# Patient Record
Sex: Female | Born: 1985 | Race: Black or African American | Hispanic: No | Marital: Single | Smoking: Current every day smoker
Health system: Southern US, Community
[De-identification: ages and names within clinical notes are randomized; demographics above are authoritative.]

## PROBLEM LIST (undated history)

## (undated) DIAGNOSIS — J45909 Unspecified asthma, uncomplicated: Secondary | ICD-10-CM

---

## 2010-12-29 ENCOUNTER — Inpatient Hospital Stay (HOSPITAL_COMMUNITY)
Admission: AD | Admit: 2010-12-29 | Discharge: 2010-12-29 | Payer: Self-pay | Source: Home / Self Care | Attending: Obstetrics & Gynecology | Admitting: Obstetrics & Gynecology

## 2010-12-29 LAB — RAPID URINE DRUG SCREEN, HOSP PERFORMED
Amphetamines: NOT DETECTED
Barbiturates: NOT DETECTED
Benzodiazepines: NOT DETECTED
Cocaine: NOT DETECTED
Opiates: NOT DETECTED
Tetrahydrocannabinol: NOT DETECTED

## 2010-12-29 LAB — CBC
HCT: 27 % — ABNORMAL LOW (ref 36.0–46.0)
Hemoglobin: 8.8 g/dL — ABNORMAL LOW (ref 12.0–15.0)
MCH: 25.7 pg — ABNORMAL LOW (ref 26.0–34.0)
MCHC: 32.6 g/dL (ref 30.0–36.0)
MCV: 78.9 fL (ref 78.0–100.0)
Platelets: 192 10*3/uL (ref 150–400)
RBC: 3.42 MIL/uL — ABNORMAL LOW (ref 3.87–5.11)
RDW: 13.5 % (ref 11.5–15.5)
WBC: 6.6 10*3/uL (ref 4.0–10.5)

## 2010-12-29 LAB — TYPE AND SCREEN
ABO/RH(D): O NEG
Antibody Screen: NEGATIVE

## 2010-12-29 LAB — RAPID HIV SCREEN (WH-MAU): Rapid HIV Screen: NONREACTIVE

## 2010-12-30 LAB — RPR: RPR Ser Ql: NONREACTIVE

## 2010-12-30 LAB — ABO/RH: ABO/RH(D): O NEG

## 2010-12-31 LAB — STREP B DNA PROBE: Strep Group B Ag: NEGATIVE

## 2012-03-07 IMAGING — US US OB COMP +14 WK
1 series · 12 of 28 positions shown · non-contrast
Comparison: none

[Series 1: us ob comp +14 wk · 12 of 50 slices shown]
[im 2/50]
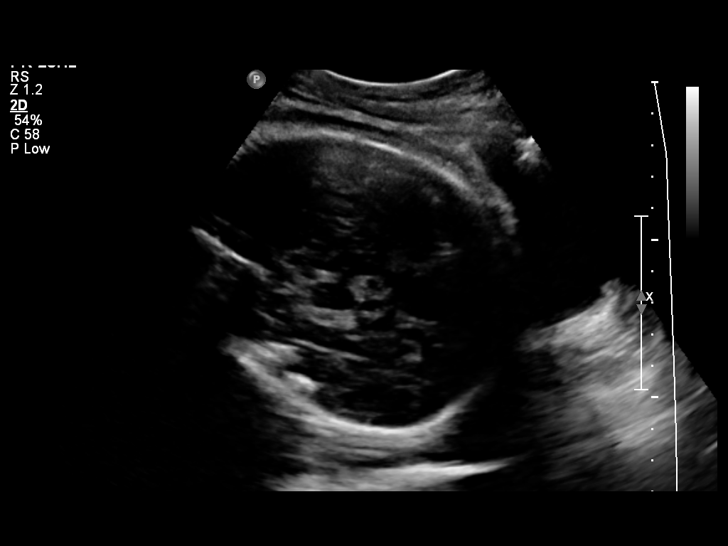
[im 6/50]
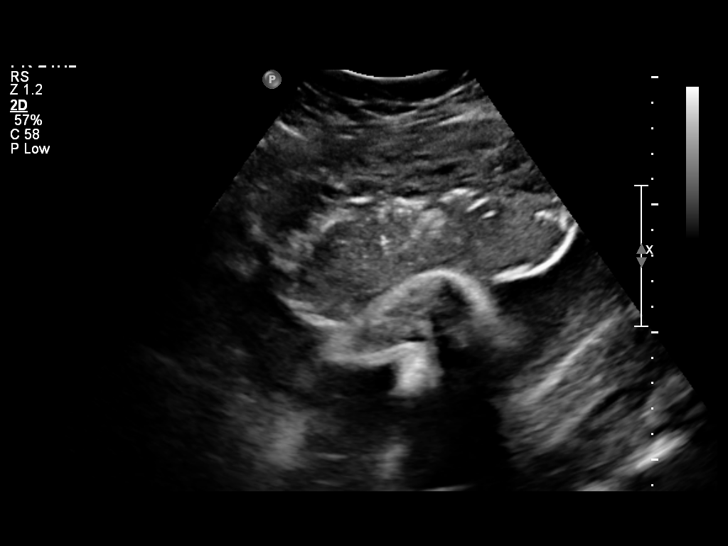
[im 10/50]
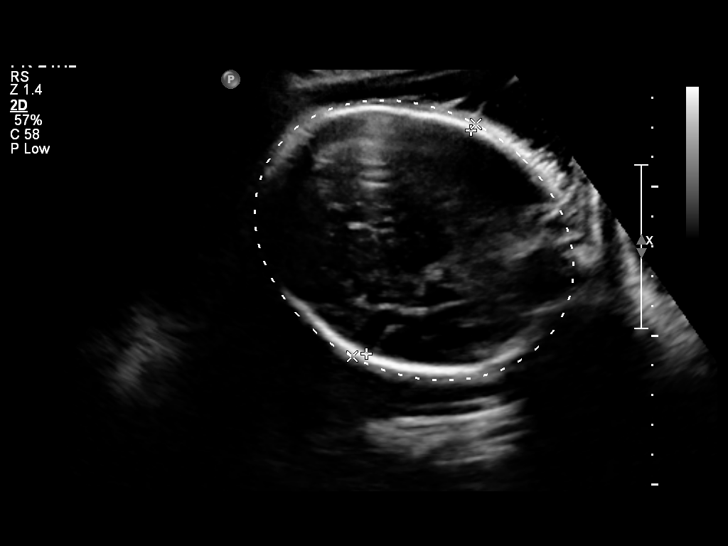
[im 15/50]
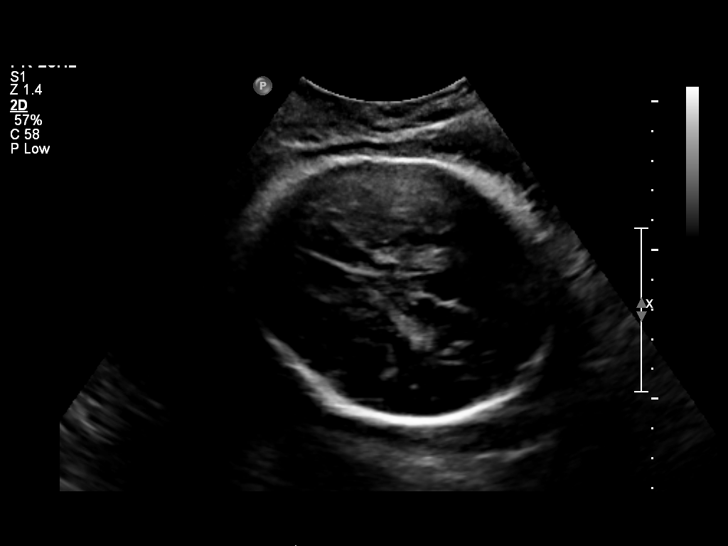
[im 19/50]
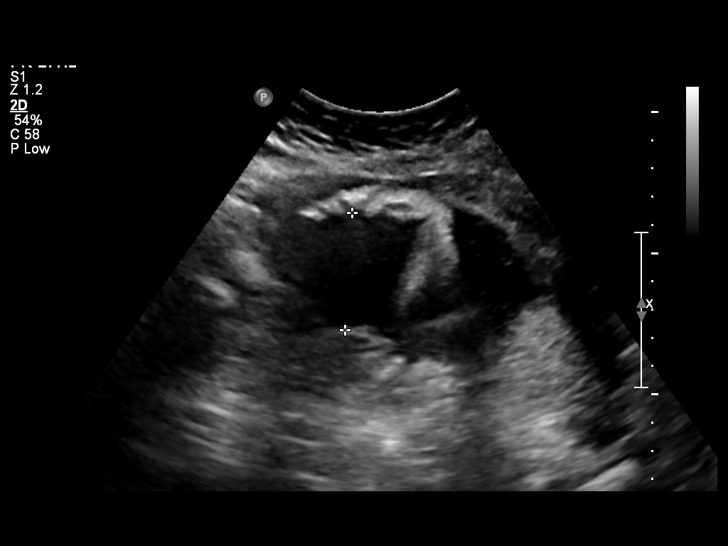
[im 22/50]
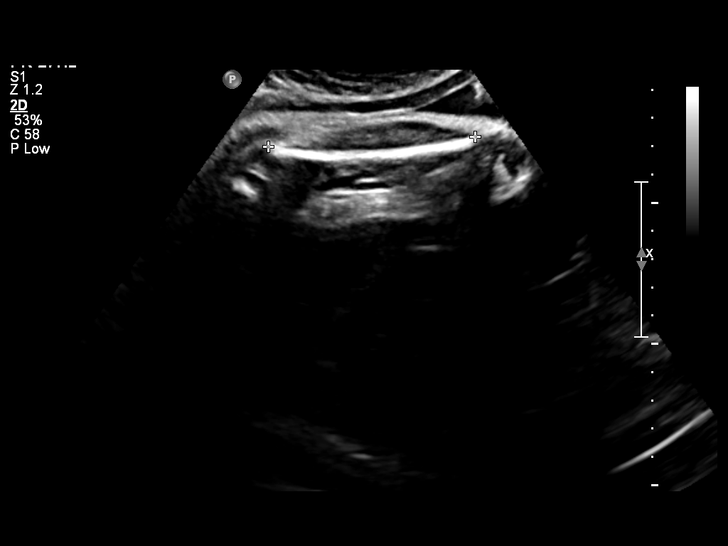
[im 28/50]
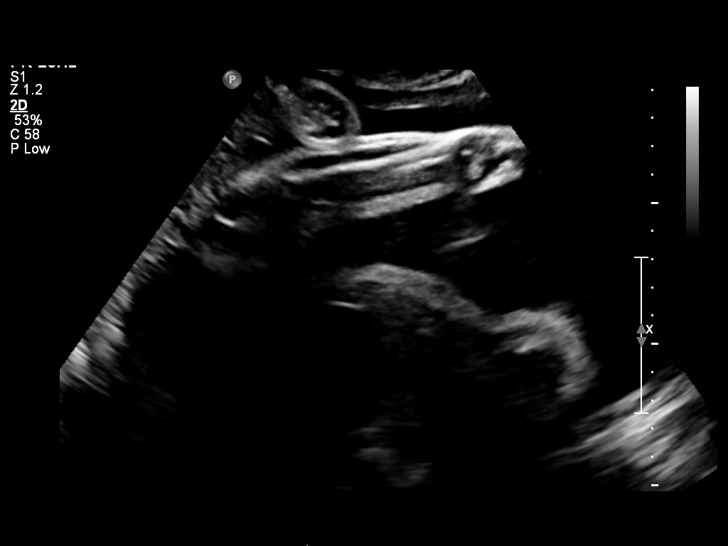
[im 31/50]
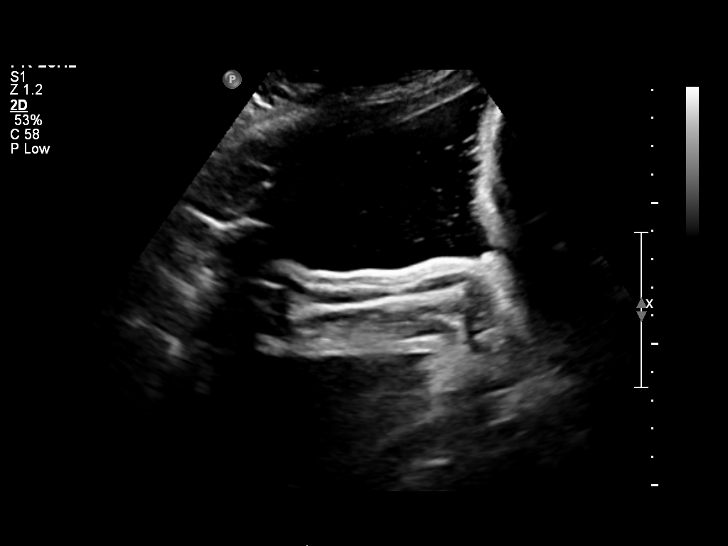
[im 35/50]
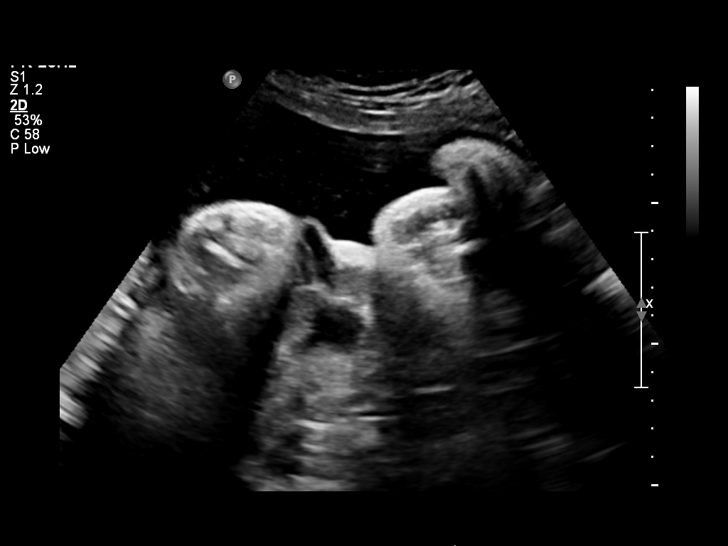
[im 40/50]
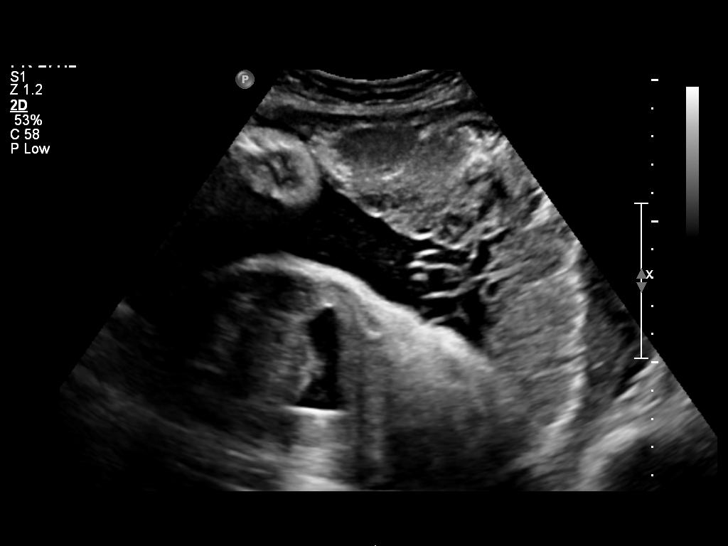
[im 44/50]
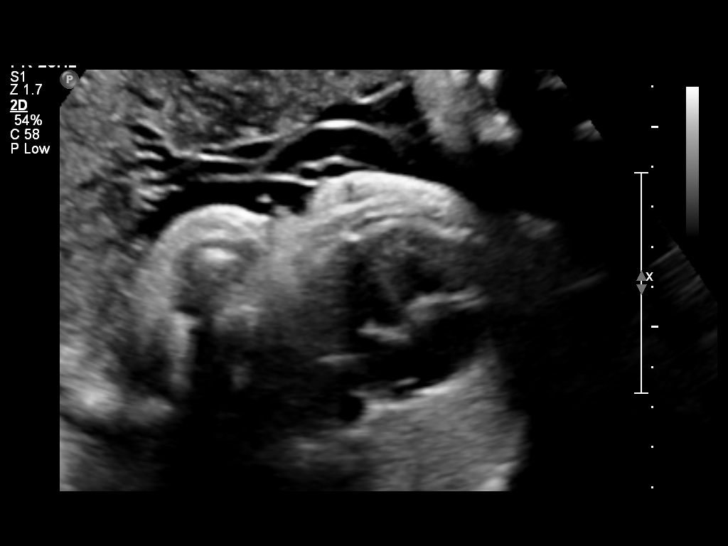
[im 48/50]
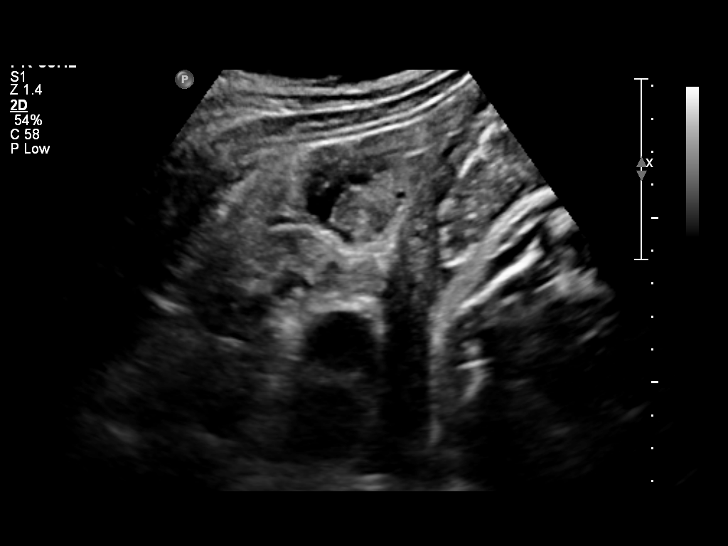

[12 of 28 positions shown; findings below may reference images not displayed]

OBSTETRICS REPORT
                      (Signed Final 12/29/2010 [DATE])

Procedures

 US OB COMP +14 WK                                     76805.1
Indications

 Migraine
 No or Little Prenatal Care
Fetal Evaluation

 Fetal Heart Rate:  133                          bpm
 Cardiac Activity:  Observed
 Presentation:      Cephalic
 Placenta:          Anterior rt, above cervical
                    os

 Amniotic Fluid
 AFI FV:      Subjectively within normal limits
 AFI Sum:     20.26   cm       89  %Tile     Larg Pckt:    7.26  cm
 RUQ:   4.88    cm   RLQ:    4.14   cm    LUQ:   7.26    cm   LLQ:    3.98   cm
Biometry

 BPD:     87.6  mm     G. Age:  35w 3d                CI:        75.04   70 - 86
                                                      FL/HC:      23.1   20.7 -

 HC:     320.8  mm     G. Age:  36w 1d      < 3  %    HC/AC:      0.97   0.87 -

 AC:     330.1  mm     G. Age:  36w 6d        7  %    FL/BPD:     84.5   71 - 87
 FL:        74  mm     G. Age:  37w 6d       14  %    FL/AC:      22.4   20 - 24

 Est. FW:    6884  gm    6 lb 12 oz      30  %
Gestational Age

 Clinical EDD:  40w 0d                                        EDD:   12/29/10
 U/S Today:     36w 4d                                        EDD:   01/22/11
 Best:          40w 0d     Det. By:  Clinical EDD             EDD:   12/29/10
Anatomy

 Cranium:           Appears normal      Aortic Arch:       Not well
                                                           visualized
 Fetal Cavum:       Not well            Ductal Arch:       Not well
                    visualized                             visualized
 Ventricles:        Appears normal      Diaphragm:         Not well
                                                           visualized
 Choroid Plexus:    Appears normal      Stomach:           Appears normal
 Cerebellum:        Not well            Abdomen:           Appears normal
                    visualized
 Posterior Fossa:   Not well            Abdominal Wall:    Appears nml
                    visualized                             (cord insert,
                                                           abd wall)
 Nuchal Fold:       Not applicable      Cord Vessels:      Appears normal
                    (>20 wks GA)                           (3 vessel cord)
 Face:              Appears normal      Kidneys:           Appear normal
                    (/orbits)
 Heart:             Appears normal      Bladder:           Appears normal
                    (4 chamber &
                    axis)
 RVOT:              Not well            Spine:             Not well
                    visualized                             visualized
 LVOT:              Not well            Limbs:             Four extremities
                    visualized                             seen

 Other:     Technically difficult due to advanced GA and fetal
            position.
Cervix Uterus Adnexa

 Cervix:       Not visualized (advanced GA >34 wks)

 Left Ovary:    Not visualized.
 Right Ovary:   Within normal limits.
Impression

 Single live IUP in cephalic presentation.  Measuring EFW
 30%ile but by dates measuring >3 wks behind GA by LMP,
 although this may in part be due to variability in biometry at
 this late gestational age. Correlation with outside prior exams
 could be helpful to determine overall growth trend.

 questions or concerns.

## 2016-10-30 ENCOUNTER — Encounter (HOSPITAL_BASED_OUTPATIENT_CLINIC_OR_DEPARTMENT_OTHER): Payer: Self-pay | Admitting: Emergency Medicine

## 2016-10-30 ENCOUNTER — Emergency Department (HOSPITAL_BASED_OUTPATIENT_CLINIC_OR_DEPARTMENT_OTHER)
Admission: EM | Admit: 2016-10-30 | Discharge: 2016-10-30 | Disposition: A | Discharge status: Home / Self Care | Payer: Medicaid Other | Attending: Emergency Medicine | Admitting: Emergency Medicine

## 2016-10-30 DIAGNOSIS — H9202 Otalgia, left ear: Secondary | ICD-10-CM | POA: Insufficient documentation

## 2016-10-30 DIAGNOSIS — K08199 Complete loss of teeth due to other specified cause, unspecified class: Secondary | ICD-10-CM

## 2016-10-30 DIAGNOSIS — R51 Headache: Secondary | ICD-10-CM | POA: Insufficient documentation

## 2016-10-30 DIAGNOSIS — K08409 Partial loss of teeth, unspecified cause, unspecified class: Secondary | ICD-10-CM | POA: Diagnosis not present

## 2016-10-30 MED ORDER — OXYCODONE-ACETAMINOPHEN 5-325 MG PO TABS: 1.0000 | ORAL_TABLET | Freq: Four times a day (QID) | ORAL | 0 refills | Status: DC | PRN

## 2016-10-30 MED ORDER — IBUPROFEN 600 MG PO TABS: 600.0000 mg | ORAL_TABLET | Freq: Four times a day (QID) | ORAL | 0 refills | Status: DC | PRN

## 2016-10-30 NOTE — ED Notes (Signed)
Pt c/o left ear and head pain onset Wed. Denies trouble hearing, drainage or other s/s. Has not taken any meds for same. Had teeth pulled Wed and they gave her meds for same which she took and are not helping.

## 2016-10-30 NOTE — ED Provider Notes (Signed)
[facility]   CSN: [id number] Arrival date & time: 10/30/16  1721  By signing my name below, I, Phillis HaggisGabriella Gaje, attest that this documentation has been prepared under the direction and in the presence of Melburn HakeNicole Kileen Lange, [place]C. Electronically Signed: Phillis HaggisGabriella Gaje, ED Scribe. 10/30/16. 6:06 PM.  History   Chief Complaint Chief Complaint  Patient presents with  . Dental Pain   The history is provided by the patient. No language interpreter was used.   HPI Comments: Paula Wilkerson is a 30 y.o. female who presents to the Emergency Department complaining of gradually worsening, throbbing left ear pain s/p left upper and lower tooth extractions occurring 4 days ago. She reports associated mild intermittent headache. Pt has worsening pain with palpation of the area. She has been taking Lortab as prescribed by the dentist for her symptoms with no relief. Pt has a follow up appointment with her dentist next week. She denies fever, chills, dental pain, congestion, ear discharge, facial or neck swelling, sinus pressure, or sore throat.   History reviewed. No pertinent past medical history.  There are no active problems to display for this patient.   Past Surgical History:  Procedure Laterality Date  . CESAREAN SECTION      OB History    No data available       Home Medications    Prior to Admission medications   Medication Sig Start Date End Date Taking? Authorizing Provider  ibuprofen (ADVIL,MOTRIN) 600 MG tablet Take 1 tablet (600 mg total) by mouth every 6 (six) hours as needed. 10/30/16   Barrett HenleNicole Elizabeth Boden Stucky, PA-C  oxyCODONE-acetaminophen (PERCOCET/ROXICET) 5-325 MG tablet Take 1 tablet by mouth every 6 (six) hours as needed. 10/30/16   Barrett HenleNicole Elizabeth Shawnita Krizek, PA-C    Family History History reviewed. No pertinent family history.  Social History Social History  Substance Use Topics  . Smoking status: Not on file  . Smokeless tobacco: Not on file   . Alcohol use Not on file     Allergies   Patient has no known allergies.  Review of Systems Review of Systems  Constitutional: Negative for chills and fever.  HENT: Positive for ear pain. Negative for congestion, dental problem, ear discharge, facial swelling, sinus pressure and sore throat.   Neurological: Positive for headaches.   Physical Exam Updated Vital Signs BP 132/87   Pulse 83   Temp 98.2 F (36.8 C)   Resp 18   Wt 91.6 kg   LMP 10/16/2016 (Approximate)   SpO2 100%   Physical Exam  Constitutional: She is oriented to person, place, and time. She appears well-developed and well-nourished.  HENT:  Head: Normocephalic and atraumatic.  Right Ear: Tympanic membrane and ear canal normal.  Left Ear: Hearing, tympanic membrane, external ear and ear canal normal. No lacerations. No drainage, swelling or tenderness. No foreign bodies. No mastoid tenderness. Tympanic membrane is not injected, not scarred, not perforated, not erythematous, not retracted and not bulging. Tympanic membrane mobility is normal.  No middle ear effusion. No hemotympanum. No decreased hearing is noted.  Mouth/Throat: Uvula is midline and oropharynx is clear and moist. No trismus in the jaw. Abnormal dentition. No dental abscesses. No oropharyngeal exudate, posterior oropharyngeal edema, posterior oropharyngeal erythema or tonsillar abscesses. No tonsillar exudate.  Two posterior left upper and lower molars extracted with no significant swelling, erythema, induration, fluctuance, or drainage. Mild TTP of the left upper and lower gumlines around these areas. No drooling. Pt tolerating secretions.   Eyes:  Conjunctivae and EOM are normal. Right eye exhibits no discharge. Left eye exhibits no discharge. No scleral icterus.  Neck: Normal range of motion. Neck supple.  Cardiovascular: Normal rate, regular rhythm, normal heart sounds and intact distal pulses.   Pulmonary/Chest: Effort normal and breath sounds  normal.  Abdominal: Soft. She exhibits no distension.  Musculoskeletal: She exhibits no edema.  Lymphadenopathy:    She has no cervical adenopathy.  Neurological: She is alert and oriented to person, place, and time.  Skin: Skin is warm and dry.  Nursing note and vitals reviewed.    ED Treatments / Results  DIAGNOSTIC STUDIES: Oxygen Saturation is 100% on RA, normal by my interpretation.    COORDINATION OF CARE: 6:05 PM-Discussed treatment plan which includes pain control with pt at bedside and pt agreed to plan.    Labs (all labs ordered are listed, but only abnormal results are displayed) Labs Reviewed - No data to display  EKG  EKG Interpretation None       Radiology No results found.  Procedures Procedures (including critical care time)  Medications Ordered in ED Medications - No data to display   Initial Impression / Assessment and Plan / ED Course  I have reviewed the triage vital signs and the nursing notes.  Pertinent labs & imaging results that were available during my care of the patient were reviewed by me and considered in my medical decision making (see chart for details).  Clinical Course     Patient presents with left ear pain that hasn't present since having multiple left upper and lower molars extracted 4 days ago. She reports she was given Lortab by her dentist without relief of symptoms. Denies fever, ear drainage, loss of hearing, facial swelling. VSS. Exam revealed extraction of left 2 upper and lower posterior molars with no signs of infection or abscess present, mild tenderness palpation. Ear exam unremarkable, TMs clear and normal, hearing grossly intact. No mastoid tenderness. Suspect patient's symptoms are likely related to recent teeth extraction. Plan to discharge patient home with pain meds and symptomatically treatment. Advised patient to follow up with her dentist sooner if her symptoms have not improved. Discussed return  precautions.  Final Clinical Impressions(s) / ED Diagnoses   Final diagnoses:  Left ear pain   I personally performed the services described in this documentation, which was scribed in my presence. The recorded information has been reviewed and is accurate.   New Prescriptions New Prescriptions   IBUPROFEN (ADVIL,MOTRIN) 600 MG TABLET    Take 1 tablet (600 mg total) by mouth every 6 (six) hours as needed.   OXYCODONE-ACETAMINOPHEN (PERCOCET/ROXICET) 5-325 MG TABLET    Take 1 tablet by mouth every 6 (six) hours as needed.     Satira Sarkicole Elizabeth DowningNadeau, [place]-C 10/30/16 1812    Nira ConnPedro Eduardo Cardama, MD 10/31/16 1325

## 2016-10-30 NOTE — ED Triage Notes (Signed)
Pt in c/o L ear pain but had tooth pulled from L upper side x 4 days ago. Pt alert, interactive, ambulatory in NAD.

## 2016-10-30 NOTE — Discharge Instructions (Signed)
Take your medications as prescribed as needed for pain relief.  I recommend following up with your dentist at your scheduled appointment, however if your pain has not improved over the next 1-2 days I recommend calling them sooner to schedule an earlier follow-up appointment. Please return to the Emergency Department if symptoms worsen or new onset of fever, facial/neck swelling, difficulty breathing, difficulty swallowing resulting in drooling, unable to open your jaw, ear drainage, loss of hearing, vomiting, unable to keep fluids down.

## 2016-10-30 NOTE — ED Notes (Signed)
Pt given d/c instructions as per chart. Verbalizes understanding. No questions. Rx x 2 with precautions. 

## 2016-11-21 ENCOUNTER — Emergency Department (HOSPITAL_BASED_OUTPATIENT_CLINIC_OR_DEPARTMENT_OTHER)
Admission: EM | Admit: 2016-11-21 | Discharge: 2016-11-21 | Disposition: A | Discharge status: Home / Self Care | Payer: Medicaid Other | Attending: Emergency Medicine | Admitting: Emergency Medicine

## 2016-11-21 ENCOUNTER — Emergency Department (HOSPITAL_BASED_OUTPATIENT_CLINIC_OR_DEPARTMENT_OTHER): Payer: Medicaid Other

## 2016-11-21 ENCOUNTER — Encounter (HOSPITAL_BASED_OUTPATIENT_CLINIC_OR_DEPARTMENT_OTHER): Payer: Self-pay | Admitting: Emergency Medicine

## 2016-11-21 DIAGNOSIS — Y9389 Activity, other specified: Secondary | ICD-10-CM | POA: Diagnosis not present

## 2016-11-21 DIAGNOSIS — X503XXA Overexertion from repetitive movements, initial encounter: Secondary | ICD-10-CM

## 2016-11-21 DIAGNOSIS — M778 Other enthesopathies, not elsewhere classified: Secondary | ICD-10-CM | POA: Diagnosis not present

## 2016-11-21 DIAGNOSIS — M70842 Other soft tissue disorders related to use, overuse and pressure, left hand: Secondary | ICD-10-CM | POA: Insufficient documentation

## 2016-11-21 DIAGNOSIS — F17200 Nicotine dependence, unspecified, uncomplicated: Secondary | ICD-10-CM | POA: Diagnosis not present

## 2016-11-21 DIAGNOSIS — M25532 Pain in left wrist: Secondary | ICD-10-CM | POA: Diagnosis present

## 2016-11-21 DIAGNOSIS — T148XXA Other injury of unspecified body region, initial encounter: Secondary | ICD-10-CM

## 2016-11-21 NOTE — ED Notes (Signed)
Pt given d/c instructions as per chart. Verbalizes understanding. No questions. 

## 2016-11-21 NOTE — Discharge Instructions (Signed)
Wear wrist brace for at least 1 week for rest and comfort. Use ice or heat, and elevate wrist throughout the day, using ice/heat pack for no more than 20 minutes every hour.  Alternate between tylenol and motrin as needed for pain. Follow up with the sports medicine specialist in the next 1-2 weeks for recheck of symptoms, that can be cancelled within 48 hours if symptoms completely resolve. Return to the ER for changes or worsening symptoms.

## 2016-11-21 NOTE — ED Provider Notes (Signed)
[facility]   CSN: [id number] Arrival date & time: 11/21/16  1827  By signing my name below, I, Soijett Blue, attest that this documentation has been prepared under the direction and in the presence of Levi StraussMercedes Camprubi-Soms, VF CorporationPA-C Electronically Signed: Soijett Blue, ED Scribe. 11/21/16. 8:29 PM.   History   Chief Complaint Chief Complaint  Patient presents with  . Wrist Pain    HPI Paula Wilkerson is a 30 y.o. female who presents to the Emergency Department complaining of left wrist pain onset 3 days. Pt describes her left wrist pain as 10/10, constant, aching and throbbing, and it does radiate to her left fingers and left forearm. She states that cold air worsens her left wrist pain. 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 She reports that she has not tried anything for her symptoms, no known alleviating factors. Pt denies bruising, redness, swelling, warmth, numbness, tingling, focal weakness, fever, chills, CP, SOB, abdominal pain, nausea, vomiting, diarrhea, constipation, urinary symptoms, rashes/skin changes, and any other symptoms. Denies injury or falls.    The history is provided by the patient and medical records. No language interpreter was used.  Wrist Pain  This is a new problem. The current episode started more than 2 days ago. The problem occurs constantly. The problem has not changed since onset.Pertinent negatives include no chest pain, no abdominal pain and no shortness of breath. The symptoms are aggravated by bending (cold air). Nothing relieves the symptoms. She has tried nothing for the symptoms. The treatment provided no relief.    History reviewed. No pertinent past medical history.  There are no active problems to display for this patient.   Past Surgical History:  Procedure Laterality Date  . CESAREAN SECTION      OB History    No data available       Home Medications    Prior to Admission  medications   Medication Sig Start Date End Date Taking? Authorizing Provider  ibuprofen (ADVIL,MOTRIN) 600 MG tablet Take 1 tablet (600 mg total) by mouth every 6 (six) hours as needed. 10/30/16   Barrett HenleNicole Elizabeth Nadeau, PA-C  oxyCODONE-acetaminophen (PERCOCET/ROXICET) 5-325 MG tablet Take 1 tablet by mouth every 6 (six) hours as needed. 10/30/16   Barrett HenleNicole Elizabeth Nadeau, PA-C    Family History History reviewed. No pertinent family history.  Social History Social History  Substance Use Topics  . Smoking status: Current Every Day Smoker  . Smokeless tobacco: Never Used  . Alcohol use No     Allergies   Patient has no known allergies.   Review of Systems Review of Systems  Constitutional: Negative for chills and fever.  Respiratory: Negative for shortness of breath.   Cardiovascular: Negative for chest pain.  Gastrointestinal: Negative for abdominal pain, constipation, diarrhea, nausea and vomiting.  Genitourinary: Negative for dysuria and hematuria.  Musculoskeletal: Positive for arthralgias. Negative for joint swelling and myalgias.  Skin: Negative for color change and wound.  Allergic/Immunologic: Negative for immunocompromised state.  Neurological: Negative for weakness and numbness.  Psychiatric/Behavioral: Negative for confusion.     A complete 10 system review of systems was obtained and all systems are negative except as noted in the HPI and PMH.   Physical Exam Updated Vital Signs BP 113/87 (BP Location: Right Arm)   Pulse 85   Temp 98.2 F (36.8 C) (Oral)   Resp 18   Ht 5\' 5"  (1.651 m)   Wt 204 lb 3.2 oz (92.6  kg)   LMP 11/16/2016   SpO2 100%   BMI 33.98 kg/m   Physical Exam  Constitutional: She is oriented to person, place, and time. Vital signs are normal. She appears well-developed and well-nourished.  Non-toxic appearance. No distress.  Afebrile, nontoxic, NAD  HENT:  Head: Normocephalic and atraumatic.  Mouth/Throat: Mucous membranes are normal.    Eyes: Conjunctivae and EOM are normal. Right eye exhibits no discharge. Left eye exhibits no discharge.  Neck: Normal range of motion. Neck supple.  Cardiovascular: Normal rate and intact distal pulses.   Pulmonary/Chest: Effort normal. No respiratory distress.  Abdominal: Normal appearance. She exhibits no distension.  Musculoskeletal: Normal range of motion.       Left wrist: She exhibits tenderness and bony tenderness. She exhibits normal range of motion, no swelling, no crepitus and no deformity.  Left wrist with FROM intact, with mild TTP along the carpal bones diffusely, no swelling or bruising, no erythema or warmth, no crepitus or deformity, negative tinel's and Phalen's signs, with Strength and sensation grossly intact. Distal pulses intact, compartments soft.   Neurological: She is alert and oriented to person, place, and time. She has normal strength. No sensory deficit.  Skin: Skin is warm, dry and intact. No rash noted.  Psychiatric: She has a normal mood and affect. Her behavior is normal.  Nursing note and vitals reviewed.    ED Treatments / Results  DIAGNOSTIC STUDIES: Oxygen Saturation is 100% on RA, nl by my interpretation.    COORDINATION OF CARE: 8:15 PM Discussed treatment plan with pt at bedside which includes left wrist xray, ice, ibuprofen, wrist splint, and pt agreed to plan.   Radiology Dg Wrist Complete Left  Result Date: 11/21/2016 CLINICAL DATA:  Wrist pain, likely  over use injury. EXAM: LEFT WRIST - COMPLETE 3+ VIEW COMPARISON:  None. FINDINGS: There is no evidence of fracture or dislocation. There is no evidence of arthropathy or other focal bone abnormality. Soft tissues are unremarkable. IMPRESSION: Negative. Electronically Signed   By: Paulina FusiMark  Shogry M.D.   On: 11/21/2016 19:06    Procedures Procedures (including critical care time)  Medications Ordered in ED Medications - No data to display   Initial Impression / Assessment and Plan / ED Course   I have reviewed the triage vital signs and the nursing notes.  Pertinent imaging results that were available during my care of the patient were reviewed by me and considered in my medical decision making (see chart for details).  Clinical Course     30 y.o. 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 Mild tenderness across carpal area, NVI with soft compartments, FROM intact. Xray done in triage which was neg. Will apply velcro wrist splint for comfort, discussed RICE, tylenol/motrin for pain, and f/up with sports med in 1-2wks for ongoing pain. I explained the diagnosis and have given explicit precautions to return to the ER including for any other new or worsening symptoms. The patient understands and accepts the medical plan as it's been dictated and I have answered their questions. Discharge instructions concerning home care and prescriptions have been given. The patient is STABLE and is discharged to home in good condition.   I personally performed the services described in this documentation, which was scribed in my presence. The recorded information has been reviewed and is accurate.   Final Clinical Impressions(s) / ED Diagnoses   Final diagnoses:  Acute pain of left wrist  Overuse injury  Tendonitis of wrist, left    New Prescriptions New Prescriptions   No medications on file      Allen Derry, PA-C 11/21/16 2034    Alvira Monday, MD 11/22/16 1217

## 2016-11-21 NOTE — ED Triage Notes (Signed)
Patient states that she is having pain to her left wrist. Patient denies any Numbness or tingling. Woke up with the pain

## 2016-11-21 NOTE — ED Notes (Signed)
During triage the patient reports that sometimes her pain is worse than a 10 and the pain radiates up into her arm. Patient is laughing and talking with friend while scrolling through facebook. Reports that she "dont want no shots". Also reported that this RN was going to do an x-ray based on protocol and she states "it aint broke"

## 2018-08-05 ENCOUNTER — Encounter (HOSPITAL_BASED_OUTPATIENT_CLINIC_OR_DEPARTMENT_OTHER): Payer: Self-pay | Admitting: Emergency Medicine

## 2018-08-05 ENCOUNTER — Other Ambulatory Visit: Payer: Self-pay

## 2018-08-05 ENCOUNTER — Emergency Department (HOSPITAL_BASED_OUTPATIENT_CLINIC_OR_DEPARTMENT_OTHER)
Admission: EM | Admit: 2018-08-05 | Discharge: 2018-08-05 | Disposition: A | Discharge status: Home / Self Care | Payer: Medicaid Other | Attending: Emergency Medicine | Admitting: Emergency Medicine

## 2018-08-05 DIAGNOSIS — J029 Acute pharyngitis, unspecified: Secondary | ICD-10-CM | POA: Diagnosis present

## 2018-08-05 DIAGNOSIS — J020 Streptococcal pharyngitis: Secondary | ICD-10-CM

## 2018-08-05 DIAGNOSIS — F17200 Nicotine dependence, unspecified, uncomplicated: Secondary | ICD-10-CM | POA: Diagnosis not present

## 2018-08-05 LAB — GROUP A STREP BY PCR: Group A Strep by PCR: DETECTED — AB

## 2018-08-05 MED ORDER — PENICILLIN G BENZATHINE 1200000 UNIT/2ML IM SUSP
1.2000 10*6.[IU] | Freq: Once | INTRAMUSCULAR | Status: AC
  Administered 2018-08-05: 1.2 10*6.[IU] via INTRAMUSCULAR
  Filled 2018-08-05: qty 2

## 2018-08-05 MED ORDER — IBUPROFEN 100 MG/5ML PO SUSP
800.0000 mg | Freq: Once | ORAL | Status: AC
  Administered 2018-08-05: 800 mg via ORAL
  Filled 2018-08-05: qty 40

## 2018-08-05 MED ORDER — ACETAMINOPHEN 325 MG PO TABS
650.0000 mg | ORAL_TABLET | Freq: Once | ORAL | Status: AC | PRN
  Administered 2018-08-05: 650 mg via ORAL
  Filled 2018-08-05: qty 2

## 2018-08-05 MED ORDER — IBUPROFEN 800 MG PO TABS
800.0000 mg | ORAL_TABLET | Freq: Once | ORAL | Status: DC
  Filled 2018-08-05: qty 1

## 2018-08-05 MED ORDER — DEXAMETHASONE SODIUM PHOSPHATE 10 MG/ML IJ SOLN
10.0000 mg | Freq: Once | INTRAMUSCULAR | Status: AC
  Administered 2018-08-05: 10 mg via INTRAVENOUS
  Filled 2018-08-05: qty 1

## 2018-08-05 NOTE — ED Provider Notes (Signed)
[facility] EMERGENCY DEPARTMENT Provider Note   CSN: [id number] Arrival date & time: 08/05/18  1619     History   Chief Complaint Chief Complaint  Patient presents with  . Fever  . Sore Throat    HPI Paula Wilkerson is a 32 y.o. female without significant past medical hx who presents to the ED with complaints of sore throat that started yesterday. Pain is constant, progressively worsening, worse with swallowing, but she is able to swallow. No alleviating factors. No meds PTA. Reports associated fevers and general malaise. Denies dyspnea, vomiting, cough, ear pain, or neck stiffness. Denies chance of pregnancy.   HPI  History reviewed. No pertinent past medical history.  There are no active problems to display for this patient.   Past Surgical History:  Procedure Laterality Date  . CESAREAN SECTION       OB History   None      Home Medications    Prior to Admission medications   Medication Sig Start Date End Date Taking? Authorizing Provider  ibuprofen (ADVIL,MOTRIN) 600 MG tablet Take 1 tablet (600 mg total) by mouth every 6 (six) hours as needed. 10/30/16   Barrett Henle, PA-C  oxyCODONE-acetaminophen (PERCOCET/ROXICET) 5-325 MG tablet Take 1 tablet by mouth every 6 (six) hours as needed. 10/30/16   Barrett Henle, PA-C    Family History History reviewed. No pertinent family history.  Social History Social History   Tobacco Use  . Smoking status: Current Every Day Smoker  . Smokeless tobacco: Never Used  Substance Use Topics  . Alcohol use: No  . Drug use: No     Allergies   Patient has no known allergies.   Review of Systems Review of Systems  Constitutional: Positive for fever.       Positive for malaise  HENT: Positive for sore throat and trouble swallowing (painful, but able). Negative for ear pain and voice change.   Respiratory: Negative for shortness of breath.   Cardiovascular: Negative for chest pain.    Gastrointestinal: Negative for vomiting.   Physical Exam Updated Vital Signs BP (!) 98/57 (BP Location: Left Arm)   Pulse 94   Temp (!) 101.4 F (38.6 C) (Oral)   Resp 18   Ht 5\' 5"  (1.651 m)   Wt 90.7 kg   LMP 08/05/2018   SpO2 100%   BMI 33.28 kg/m   Physical Exam  Constitutional: She appears well-developed and well-nourished. No distress.  HENT:  Head: Normocephalic and atraumatic.  Right Ear: Tympanic membrane is not perforated, not erythematous, not retracted and not bulging.  Left Ear: Tympanic membrane is not perforated, not erythematous, not retracted and not bulging.  Mouth/Throat: Uvula is midline. Oropharyngeal exudate and posterior oropharyngeal erythema present. Tonsils are 2+ on the right. Tonsils are 2+ on the left.  Patient tolerating her own secretions without difficulty. No trismus. No drooling. No hot potato voice. Submandibular compartment is soft.   Eyes: Pupils are equal, round, and reactive to light. Conjunctivae are normal. Right eye exhibits no discharge. Left eye exhibits no discharge.  Neck: Normal range of motion. Neck supple. No neck rigidity. No edema and no erythema present.  Cardiovascular: Normal rate and regular rhythm.  No murmur heard. Pulmonary/Chest: Breath sounds normal. No respiratory distress. She has no wheezes. She has no rales.  Abdominal: Soft. She exhibits no distension. There is no tenderness.  Lymphadenopathy:    She has cervical adenopathy.  Neurological: She is alert.  Skin: Skin is warm  and dry. No rash noted.  Psychiatric: She has a normal mood and affect. Her behavior is normal.  Nursing note and vitals reviewed.    ED Treatments / Results  Labs (all labs ordered are listed, but only abnormal results are displayed) Labs Reviewed  GROUP A STREP BY PCR - Abnormal; Notable for the following components:      Result Value   Group A Strep by PCR DETECTED (*)    All other components within normal limits     EKG None  Radiology No results found.  Procedures Procedures (including critical care time)  Medications Ordered in ED Medications  penicillin g benzathine (BICILLIN LA) 1200000 UNIT/2ML injection 1.2 Million Units (has no administration in time range)  dexamethasone (DECADRON) injection 10 mg (has no administration in time range)  acetaminophen (TYLENOL) tablet 650 mg (650 mg Oral Given 08/05/18 1637)     Initial Impression / Assessment and Plan / ED Course  I have reviewed the triage vital signs and the nursing notes.  Pertinent labs & imaging results that were available during my care of the patient were reviewed by me and considered in my medical decision making (see chart for details).    Presents with complaint of sore throat.  Patient is nontoxic-appearing, febrile and tachycardic- improved on repeat vitals.  On exam patient with tonsillar erythema, exudates, and anterior cervical lymphadenopathy.  Rapid strep test is positive.  Treated in the emergency department with IM Decadron and IM Penicillin.  Exam non concerning for PTA or RPA, there is no trismus, uvular deviation, or hot potato voice. Patient is tolerating his own secretions without difficulty, full ROM of the neck, submandibular compartment is soft. Recommended use of Tylenol and Ibuprofen for any continued discomfort or fevers. I discussed results, treatment plan, need for PCP follow-up, and return precautions with the patient. Provided opportunity for questions, patient confirmed understanding and is in agreement with plan.   Vitals:   08/05/18 1630 08/05/18 1745 08/05/18 1825  BP: 113/82 (!) 98/57 109/64  Pulse: (!) 104 94   Resp: 20 18   Temp: (!) 103.3 F (39.6 C) (!) 101.4 F (38.6 C)   TempSrc: Oral Oral   SpO2: 99% 100%   Weight: 90.7 kg    Height: 5\' 5"  (1.651 m)      Final Clinical Impressions(s) / ED Diagnoses   Final diagnoses:  Strep pharyngitis    ED Discharge Orders    None        Cherly Andersonetrucelli, Malesha Suliman R, PA-C 08/05/18 1853    Pricilla LovelessGoldston, Scott, MD 08/05/18 Windell [id number]

## 2018-08-05 NOTE — ED Triage Notes (Signed)
Patient states that she has pain to her back and her throat and is having a fever. The patient states that the fever has been going up and down. Denies taking anything today " I just came up here" The patient denies any burning with urination.

## 2018-08-05 NOTE — Discharge Instructions (Addendum)
You were seen in the emergency department and diagnosed with strep throat.  You were given a shot of Penicillin and a shot of Decadron.  - Penicillin is an antibiotic used to treat the infection - Decadron is a steroid used to treat the pain and swelling of your throat.   You should gradually feel better over the next few days. Take Tylenol and Ibuprofen for fever and pain. Drink plenty of water to stay well hydrated. Follow up with your primary care provider in the next 1 week if you are not feeling better, if you do not have a primary care provider one is provided in your discharge instructions. Return to the emergency department for any new or worsening symptoms including but not limited to inability to open your mouth, inability to move your neck, worsening pain, change in your voice, inability to swallow your own saliva, drooling, or any other concerns.

## 2019-10-28 ENCOUNTER — Emergency Department (HOSPITAL_BASED_OUTPATIENT_CLINIC_OR_DEPARTMENT_OTHER)
Admission: EM | Admit: 2019-10-28 | Discharge: 2019-10-28 | Disposition: A | Discharge status: Home / Self Care | Payer: Medicaid Other | Attending: Emergency Medicine | Admitting: Emergency Medicine

## 2019-10-28 ENCOUNTER — Emergency Department (HOSPITAL_BASED_OUTPATIENT_CLINIC_OR_DEPARTMENT_OTHER): Payer: Medicaid Other

## 2019-10-28 ENCOUNTER — Other Ambulatory Visit: Payer: Self-pay

## 2019-10-28 ENCOUNTER — Encounter (HOSPITAL_BASED_OUTPATIENT_CLINIC_OR_DEPARTMENT_OTHER): Payer: Self-pay | Admitting: *Deleted

## 2019-10-28 DIAGNOSIS — J45909 Unspecified asthma, uncomplicated: Secondary | ICD-10-CM | POA: Insufficient documentation

## 2019-10-28 DIAGNOSIS — R0789 Other chest pain: Secondary | ICD-10-CM | POA: Diagnosis not present

## 2019-10-28 DIAGNOSIS — R05 Cough: Secondary | ICD-10-CM | POA: Diagnosis present

## 2019-10-28 DIAGNOSIS — Z20828 Contact with and (suspected) exposure to other viral communicable diseases: Secondary | ICD-10-CM | POA: Insufficient documentation

## 2019-10-28 DIAGNOSIS — R519 Headache, unspecified: Secondary | ICD-10-CM | POA: Diagnosis not present

## 2019-10-28 DIAGNOSIS — F17210 Nicotine dependence, cigarettes, uncomplicated: Secondary | ICD-10-CM | POA: Insufficient documentation

## 2019-10-28 DIAGNOSIS — R6889 Other general symptoms and signs: Secondary | ICD-10-CM

## 2019-10-28 HISTORY — DX: Unspecified asthma, uncomplicated: J45.909

## 2019-10-28 MED ORDER — IBUPROFEN 600 MG PO TABS: 600.0000 mg | ORAL_TABLET | Freq: Three times a day (TID) | ORAL | 0 refills | Status: DC | PRN

## 2019-10-28 MED ORDER — BENZONATATE 100 MG PO CAPS: 100.0000 mg | ORAL_CAPSULE | Freq: Three times a day (TID) | ORAL | 0 refills | Status: DC | PRN

## 2019-10-28 NOTE — Discharge Instructions (Signed)
He was seen in the emergency department today with flulike symptoms including cough.  Take the medication as needed for symptoms.  I am testing you for COVID-19.  You need to remain in isolation until your results come back in MyChart.  You can follow along in your app.  Information is included in the discharge paperwork about this app.  Return to the emergency department any new or suddenly worsening symptoms.  I would like you to be out of work for at least the next week.

## 2019-10-28 NOTE — ED Provider Notes (Signed)
Emergency Department Provider Note   I have reviewed the triage vital signs and the nursing notes.   HISTORY  Chief Complaint Cough   HPI Paula Wilkerson is a 33 y.o. female with PMH of asthma presents to the emergency department for evaluation of flulike symptoms  for the past 2 days.  She has been experiencing body ache, cough, headache.  She does have some chest tightness but states this is only with coughing episodes.  Denies shortness of breath or chest pain without coughing.  No sick contacts but states that she does work in a [employer] and does not know everyone.  No vomiting or diarrhea.  She did have sore throat yesterday but that is mostly resolved.  No radiation of symptoms or other modifying factors.   Past Medical History:  Diagnosis Date  . Asthma     There are no active problems to display for this patient.   Past Surgical History:  Procedure Laterality Date  . CESAREAN SECTION      Allergies Patient has no known allergies.  No family history on file.  Social History Social History   Tobacco Use  . Smoking status: Current Every Day Smoker  . Smokeless tobacco: Never Used  Substance Use Topics  . Alcohol use: No  . Drug use: No    Review of Systems  Constitutional: Positive body aches. Denies fever.  Eyes: No visual changes. ENT: Sore throat resolved.  Cardiovascular: Chest tightness with coughing only.  Respiratory: Denies shortness of breath. Positive cough.  Gastrointestinal: No abdominal pain.  No nausea, no vomiting.  No diarrhea.  No constipation. Genitourinary: Negative for dysuria. Musculoskeletal: Negative for back pain. Skin: Negative for rash. Neurological: Negative for focal weakness or numbness. Positive HA.   10-point ROS otherwise negative.  ____________________________________________   PHYSICAL EXAM:  VITAL SIGNS: ED Triage Vitals  Enc Vitals Group     BP 10/28/19 1054 (!) 129/95     Pulse Rate 10/28/19 1054  82     Resp 10/28/19 1054 16     Temp 10/28/19 1054 98.7 F (37.1 C)     Temp Source 10/28/19 1054 Oral     SpO2 10/28/19 1054 100 %     Weight 10/28/19 1050 200 lb (90.7 kg)     Height 10/28/19 1050 5\' 5"  (1.651 m)   Constitutional: Alert and oriented. Well appearing and in no acute distress. Eyes: Conjunctivae are normal.  Head: Atraumatic. Nose: Mild congestion/rhinnorhea. Mouth/Throat: Mucous membranes are moist.  Oropharynx without erythema or PTA.  Neck: No stridor. Cardiovascular: Normal rate, regular rhythm. Good peripheral circulation. Grossly normal heart sounds.   Respiratory: Normal respiratory effort.  No retractions. Lungs CTAB. No wheezing. Good air movement.  Gastrointestinal: No distention.  Musculoskeletal: No gross deformities of extremities. Neurologic:  Normal speech and language.  Skin:  Skin is warm, dry and intact. No rash noted.  ____________________________________________   LABS (all labs ordered are listed, but only abnormal results are displayed)  Labs Reviewed  NOVEL CORONAVIRUS, NAA (HOSP ORDER, SEND-OUT TO REF LAB; TAT 18-24 HRS)   ____________________________________________  RADIOLOGY  Dg Chest Portable 1 View  Result Date: 10/28/2019 CLINICAL DATA:  Cough and headache EXAM: PORTABLE CHEST 1 VIEW COMPARISON:  None. FINDINGS: Lungs are clear. Heart size and pulmonary vascularity are normal. No adenopathy. No bone lesions. IMPRESSION: No edema or consolidation.  No adenopathy. Electronically Signed   By: 13/01/2019 III M.D.   On: 10/28/2019 11:54    ____________________________________________  PROCEDURES  Procedure(s) performed:   Procedures  None  ____________________________________________   INITIAL IMPRESSION / ASSESSMENT AND PLAN / ED COURSE  Pertinent labs & imaging results that were available during my care of the patient were reviewed by me and considered in my medical decision making (see chart for details).    Patient presents emergency department with flulike symptoms including cough, headache, body ache over the past 2 days.  Patient is not in respiratory distress.  She is satting 100% on room air.  No wheezing, rales, rhonchi on exam.  Good air movement.  Do not suspect acute asthma exacerbation clinically.  Chest x-ray interpreted with no infiltrate.  I have opted for outpatient COVID-19 testing.  I discussed this with the patient.  She will remain in isolation until results come back and she is been feeling well, and without fever for at least 3 days.  Work note provided along with instructions on how to access her testing results through the MyChart app.  Discussed ED return precautions in detail.  Paula Wilkerson was evaluated in Emergency Department on 10/28/2019 for the symptoms described in the history of present illness. She was evaluated in the context of the global COVID-19 pandemic, which necessitated consideration that the patient might be at risk for infection with the SARS-CoV-2 virus that causes COVID-19. Institutional protocols and algorithms that pertain to the evaluation of patients at risk for COVID-19 are in a state of rapid change based on information released by regulatory bodies including the CDC and federal and state organizations. These policies and algorithms were followed during the patient's care in the ED.  ____________________________________________  FINAL CLINICAL IMPRESSION(S) / ED DIAGNOSES  Final diagnoses:  Flu-like symptoms    NEW OUTPATIENT MEDICATIONS STARTED DURING THIS VISIT:  Discharge Medication List as of 10/28/2019 12:15 PM    START taking these medications   Details  benzonatate (TESSALON) 100 MG capsule Take 1 capsule (100 mg total) by mouth 3 (three) times daily as needed., Starting Mon 10/28/2019, Normal        Note:  This document was prepared using Dragon voice recognition software and may include unintentional dictation errors.  Nanda Quinton, MD,  [facility] Emergency Medicine    Park Beck, Wonda Olds, MD 10/28/19 1535

## 2019-10-28 NOTE — ED Triage Notes (Signed)
Cough x 2 days. Headache. Bodyaches. No fever. No known Covid exposure.

## 2019-10-29 LAB — NOVEL CORONAVIRUS, NAA (HOSP ORDER, SEND-OUT TO REF LAB; TAT 18-24 HRS): SARS-CoV-2, NAA: NOT DETECTED

## 2021-01-04 IMAGING — DX DG CHEST 1V PORT
1 series · 1 of 1 positions shown · non-contrast
Comparison: None.

CLINICAL DATA: Cough and headache

EXAM:
PORTABLE CHEST 1 VIEW

[chest ap]
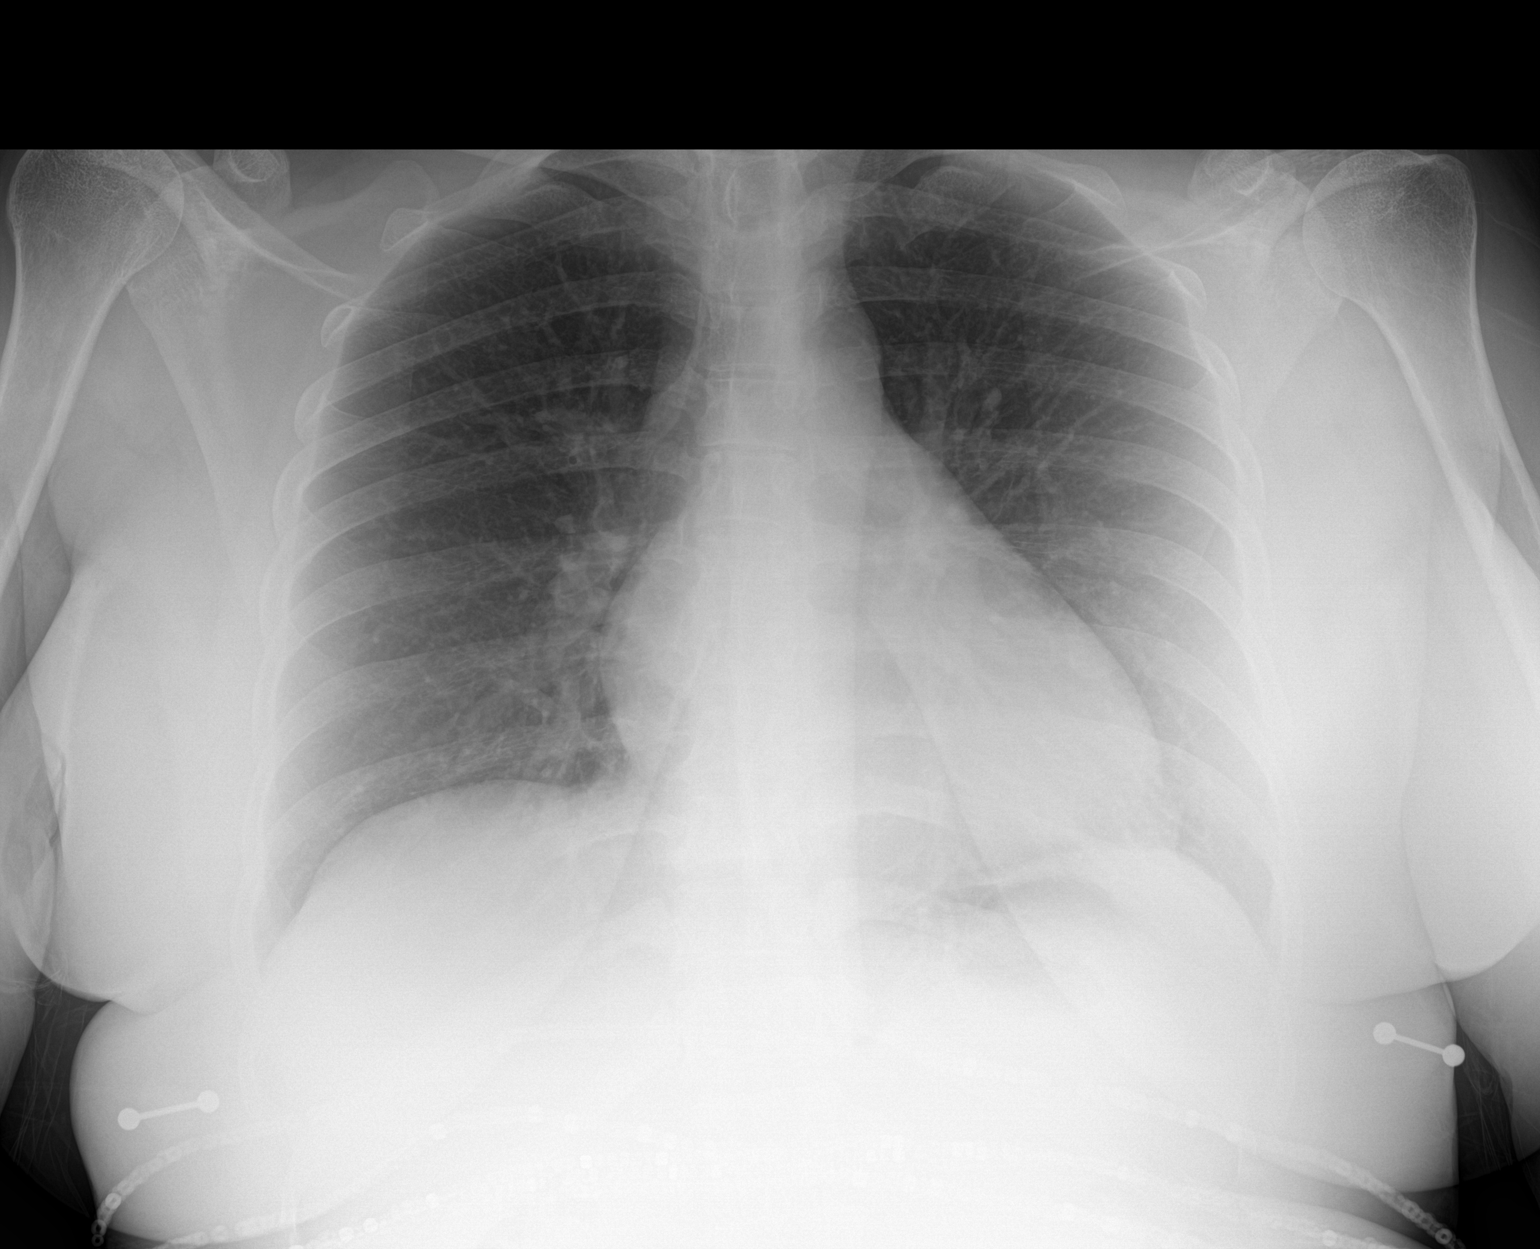

[1 of 1 positions shown; findings below may reference images not displayed]

FINDINGS: Lungs are clear. Heart size and pulmonary vascularity are normal. No
adenopathy. No bone lesions.
IMPRESSION: No edema or consolidation.  No adenopathy.

## 2023-03-27 ENCOUNTER — Emergency Department (HOSPITAL_BASED_OUTPATIENT_CLINIC_OR_DEPARTMENT_OTHER)
Admission: EM | Admit: 2023-03-27 | Discharge: 2023-03-27 | Disposition: A | Discharge status: Home / Self Care | Payer: Medicaid Other | Attending: Emergency Medicine | Admitting: Emergency Medicine

## 2023-03-27 ENCOUNTER — Emergency Department (HOSPITAL_BASED_OUTPATIENT_CLINIC_OR_DEPARTMENT_OTHER): Payer: Medicaid Other

## 2023-03-27 ENCOUNTER — Encounter (HOSPITAL_BASED_OUTPATIENT_CLINIC_OR_DEPARTMENT_OTHER): Payer: Self-pay

## 2023-03-27 ENCOUNTER — Other Ambulatory Visit: Payer: Self-pay

## 2023-03-27 DIAGNOSIS — R519 Headache, unspecified: Secondary | ICD-10-CM | POA: Diagnosis not present

## 2023-03-27 DIAGNOSIS — M546 Pain in thoracic spine: Secondary | ICD-10-CM | POA: Insufficient documentation

## 2023-03-27 DIAGNOSIS — Y92410 Unspecified street and highway as the place of occurrence of the external cause: Secondary | ICD-10-CM | POA: Insufficient documentation

## 2023-03-27 MED ORDER — ONDANSETRON 4 MG PO TBDP
4.0000 mg | ORAL_TABLET | Freq: Once | ORAL | Status: AC
  Administered 2023-03-27: 4 mg via ORAL
  Filled 2023-03-27: qty 1

## 2023-03-27 MED ORDER — KETOROLAC TROMETHAMINE 30 MG/ML IJ SOLN
30.0000 mg | Freq: Once | INTRAMUSCULAR | Status: AC
  Administered 2023-03-27: 30 mg via INTRAMUSCULAR
  Filled 2023-03-27: qty 1

## 2023-03-27 NOTE — ED Notes (Signed)
Patient wanted time to decide if she wanted IM medication.  PA aware and will discuss CT findings with patient per to patient "being able to decide if I want it and have my mind made up."
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
## 2023-07-29 ENCOUNTER — Other Ambulatory Visit: Payer: Self-pay

## 2023-07-29 ENCOUNTER — Encounter (HOSPITAL_BASED_OUTPATIENT_CLINIC_OR_DEPARTMENT_OTHER): Payer: Self-pay

## 2023-07-29 ENCOUNTER — Emergency Department (HOSPITAL_BASED_OUTPATIENT_CLINIC_OR_DEPARTMENT_OTHER): Payer: Medicaid Other

## 2023-07-29 ENCOUNTER — Emergency Department (HOSPITAL_BASED_OUTPATIENT_CLINIC_OR_DEPARTMENT_OTHER)
Admission: EM | Admit: 2023-07-29 | Discharge: 2023-07-29 | Disposition: A | Discharge status: Home / Self Care | Payer: Medicaid Other | Source: Home / Self Care | Attending: Emergency Medicine | Admitting: Emergency Medicine

## 2023-07-29 DIAGNOSIS — Y9389 Activity, other specified: Secondary | ICD-10-CM | POA: Insufficient documentation

## 2023-07-29 DIAGNOSIS — M25571 Pain in right ankle and joints of right foot: Secondary | ICD-10-CM | POA: Diagnosis present

## 2023-07-29 DIAGNOSIS — X501XXA Overexertion from prolonged static or awkward postures, initial encounter: Secondary | ICD-10-CM | POA: Insufficient documentation

## 2023-07-29 DIAGNOSIS — S93491A Sprain of other ligament of right ankle, initial encounter: Secondary | ICD-10-CM | POA: Insufficient documentation

## 2023-07-29 DIAGNOSIS — S93401A Sprain of unspecified ligament of right ankle, initial encounter: Secondary | ICD-10-CM

## 2023-07-29 MED ORDER — IBUPROFEN 400 MG PO TABS
400.0000 mg | ORAL_TABLET | Freq: Once | ORAL | Status: AC
  Administered 2023-07-29: 400 mg via ORAL
  Filled 2023-07-29: qty 1

## 2023-07-29 MED ORDER — HYDROCODONE-ACETAMINOPHEN 5-325 MG PO TABS
1.0000 | ORAL_TABLET | Freq: Once | ORAL | Status: AC
  Administered 2023-07-29: 1 via ORAL
  Filled 2023-07-29: qty 1

## 2023-07-29 NOTE — ED Provider Notes (Signed)
[facility] Provider Note   CSN: [id number] Arrival date & time: 07/29/23  0141     History  Chief Complaint  Patient presents with   Ankle Pain    Paula Wilkerson is a 37 y.o. female.  The history is provided by the patient.  Ankle Pain Location:  Ankle Ankle location:  R ankle Pain details:    Quality:  Aching   Severity:  Moderate   Onset quality:  Sudden   Timing:  Constant   Progression:  Worsening Chronicity:  New Worsened by:  Bearing weight  Patient reports she twisted her right ankle while going down a step. Reports it now hurts to bear weight on the leg.  Reports most the pain is in her right ankle.  No other traumatic injuries reported    Home Medications Prior to Admission medications   Not on File      Allergies    Patient has no known allergies.    Review of Systems   Review of Systems  Physical Exam Updated Vital Signs BP (!) 119/104   Pulse (!) 111   Temp 98.7 F (37.1 C) (Oral)   Resp 18   Ht 1.626 m (5\' 4" )   Wt 79.4 kg   LMP 07/28/2023 (Exact Date)   SpO2 100%   BMI 30.04 kg/m  Physical Exam CONSTITUTIONAL: Well developed/well nourished, anxious HEAD: Normocephalic/atraumatic NEURO: Pt is awake/alert/appropriate, moves all extremitiesx4.  No facial droop.   EXTREMITIES: Distal pulses intact in right foot.  Mild swelling noted to the right lateral malleolus.  No lacerations.  No deformities.  There is no tenderness to the right knee or proximal fibula.  Right Achilles is intact There is no tenderness noted to the right distal foot SKIN: warm, color normal PSYCH: Anxious  ED Results / Procedures / Treatments   Labs (all labs ordered are listed, but only abnormal results are displayed) Labs Reviewed - No data to display  EKG None  Radiology DG Ankle Complete Right  Result Date: 07/29/2023 CLINICAL DATA:  Trauma, fall down stairs with right ankle pain. EXAM: RIGHT ANKLE - COMPLETE  3+ VIEW COMPARISON:  None available. FINDINGS: There is no evidence of fracture, dislocation, or joint effusion. There is no evidence of arthropathy or other focal bone abnormality. Soft tissue swelling is present about the ankle and most pronounced over the lateral malleolus. IMPRESSION: No acute fracture or dislocation. Electronically Signed   By: Thornell Sartorius M.D.   On: 07/29/2023 02:16    Procedures Procedures    Medications Ordered in ED Medications  HYDROcodone-acetaminophen (NORCO/VICODIN) 5-325 MG per tablet 1 tablet (1 tablet Oral Given 07/29/23 0224)  ibuprofen (ADVIL) tablet 400 mg (400 mg Oral Given 07/29/23 0224)    ED Course/ Medical Decision Making/ A&P                                 Medical Decision Making Amount and/or Complexity of Data Reviewed Radiology: ordered.  Risk Prescription drug management.   Patient presents with pain in right ankle after twisting it going down the stairs. She has pain and swelling to the right ankle.  I have reviewed the x-ray there is no acute fractures.  There is no dislocation.  Suspect ankle sprain. Will give crutches, advise elevation, rest and ice. Will refer to orthopedics if no improvement in 1 week  Final Clinical Impression(s) / ED Diagnoses Final diagnoses:  Sprain of right ankle, unspecified ligament, initial encounter    Rx / DC Orders ED Discharge Orders     None         Zadie Rhine, MD 07/29/23 [phone]

## 2023-07-29 NOTE — ED Triage Notes (Signed)
Pt fell down one stair earlier tonight. Pt reports R ankle pain.

## 2024-01-02 ENCOUNTER — Other Ambulatory Visit: Payer: Self-pay | Admitting: Physician Assistant

## 2024-01-02 DIAGNOSIS — Z1231 Encounter for screening mammogram for malignant neoplasm of breast: Secondary | ICD-10-CM

## 2024-03-19 ENCOUNTER — Ambulatory Visit: Payer: Medicaid Other | Admitting: Obstetrics & Gynecology
